# Patient Record
Sex: Male | Born: 1975 | Race: Black or African American | Hispanic: No | Marital: Single | Smoking: Never smoker
Health system: Southern US, Community
[De-identification: ages and names within clinical notes are randomized; demographics above are authoritative.]

## PROBLEM LIST (undated history)

## (undated) DIAGNOSIS — J189 Pneumonia, unspecified organism: Secondary | ICD-10-CM

## (undated) DIAGNOSIS — G43909 Migraine, unspecified, not intractable, without status migrainosus: Secondary | ICD-10-CM

## (undated) HISTORY — PX: TONSILLECTOMY: SUR1361

---

## 1998-09-17 ENCOUNTER — Encounter: Admission: RE | Admit: 1998-09-17 | Discharge: 1998-09-17 | Payer: Self-pay | Admitting: Family Medicine

## 1999-01-08 ENCOUNTER — Emergency Department (HOSPITAL_COMMUNITY): Admission: EM | Admit: 1999-01-08 | Discharge: 1999-01-08 | Payer: Self-pay | Admitting: Emergency Medicine

## 1999-03-26 ENCOUNTER — Emergency Department (HOSPITAL_COMMUNITY): Admission: EM | Admit: 1999-03-26 | Discharge: 1999-03-26 | Payer: Self-pay | Admitting: Emergency Medicine

## 1999-12-11 ENCOUNTER — Emergency Department (HOSPITAL_COMMUNITY): Admission: EM | Admit: 1999-12-11 | Discharge: 1999-12-11 | Payer: Self-pay | Admitting: Emergency Medicine

## 1999-12-11 ENCOUNTER — Encounter: Payer: Self-pay | Admitting: Emergency Medicine

## 2000-06-30 ENCOUNTER — Emergency Department (HOSPITAL_COMMUNITY): Admission: EM | Admit: 2000-06-30 | Discharge: 2000-06-30 | Payer: Self-pay | Admitting: Emergency Medicine

## 2001-07-02 ENCOUNTER — Emergency Department (HOSPITAL_COMMUNITY): Admission: EM | Admit: 2001-07-02 | Discharge: 2001-07-02 | Payer: Self-pay | Admitting: Emergency Medicine

## 2001-09-11 ENCOUNTER — Emergency Department (HOSPITAL_COMMUNITY): Admission: EM | Admit: 2001-09-11 | Discharge: 2001-09-11 | Payer: Self-pay | Admitting: Emergency Medicine

## 2001-11-01 ENCOUNTER — Emergency Department (HOSPITAL_COMMUNITY): Admission: EM | Admit: 2001-11-01 | Discharge: 2001-11-01 | Payer: Self-pay | Admitting: Emergency Medicine

## 2002-02-14 ENCOUNTER — Emergency Department (HOSPITAL_COMMUNITY): Admission: EM | Admit: 2002-02-14 | Discharge: 2002-02-14 | Payer: Self-pay | Admitting: Emergency Medicine

## 2002-06-05 ENCOUNTER — Emergency Department (HOSPITAL_COMMUNITY): Admission: EM | Admit: 2002-06-05 | Discharge: 2002-06-05 | Payer: Self-pay | Admitting: Emergency Medicine

## 2002-10-23 ENCOUNTER — Emergency Department (HOSPITAL_COMMUNITY): Admission: AD | Admit: 2002-10-23 | Discharge: 2002-10-23 | Payer: Self-pay | Admitting: Emergency Medicine

## 2003-08-08 ENCOUNTER — Emergency Department (HOSPITAL_COMMUNITY): Admission: EM | Admit: 2003-08-08 | Discharge: 2003-08-08 | Payer: Self-pay | Admitting: Emergency Medicine

## 2005-10-31 ENCOUNTER — Emergency Department (HOSPITAL_COMMUNITY): Admission: EM | Admit: 2005-10-31 | Discharge: 2005-11-01 | Payer: Self-pay | Admitting: Emergency Medicine

## 2006-04-25 ENCOUNTER — Emergency Department (HOSPITAL_COMMUNITY): Admission: EM | Admit: 2006-04-25 | Discharge: 2006-04-25 | Payer: Self-pay | Admitting: Emergency Medicine

## 2006-07-11 ENCOUNTER — Emergency Department (HOSPITAL_COMMUNITY): Admission: EM | Admit: 2006-07-11 | Discharge: 2006-07-11 | Payer: Self-pay | Admitting: Emergency Medicine

## 2007-02-25 ENCOUNTER — Emergency Department (HOSPITAL_COMMUNITY): Admission: EM | Admit: 2007-02-25 | Discharge: 2007-02-25 | Payer: Self-pay | Admitting: Emergency Medicine

## 2008-09-23 ENCOUNTER — Emergency Department (HOSPITAL_COMMUNITY): Admission: EM | Admit: 2008-09-23 | Discharge: 2008-09-23 | Payer: Self-pay | Admitting: Family Medicine

## 2009-06-14 ENCOUNTER — Emergency Department (HOSPITAL_COMMUNITY): Admission: EM | Admit: 2009-06-14 | Discharge: 2009-06-14 | Payer: Self-pay | Admitting: Family Medicine

## 2010-08-23 LAB — POCT URINALYSIS DIP (DEVICE)
Hgb urine dipstick: NEGATIVE
Protein, ur: NEGATIVE mg/dL
Specific Gravity, Urine: 1.025 (ref 1.005–1.030)
Urobilinogen, UA: 1 mg/dL (ref 0.0–1.0)
pH: 6 (ref 5.0–8.0)

## 2011-12-05 ENCOUNTER — Emergency Department (HOSPITAL_COMMUNITY)
Admission: EM | Admit: 2011-12-05 | Discharge: 2011-12-05 | Disposition: A | Discharge status: Home / Self Care | Payer: Self-pay | Attending: Emergency Medicine | Admitting: Emergency Medicine

## 2011-12-05 ENCOUNTER — Encounter (HOSPITAL_COMMUNITY): Payer: Self-pay | Admitting: *Deleted

## 2011-12-05 DIAGNOSIS — R110 Nausea: Secondary | ICD-10-CM | POA: Insufficient documentation

## 2011-12-05 DIAGNOSIS — G43909 Migraine, unspecified, not intractable, without status migrainosus: Secondary | ICD-10-CM | POA: Insufficient documentation

## 2011-12-05 HISTORY — DX: Migraine, unspecified, not intractable, without status migrainosus: G43.909

## 2011-12-05 MED ORDER — SUMATRIPTAN SUCCINATE 6 MG/0.5ML ~~LOC~~ SOLN
6.0000 mg | Freq: Once | SUBCUTANEOUS | Status: AC
  Administered 2011-12-05: 6 mg via SUBCUTANEOUS
  Filled 2011-12-05 (×2): qty 0.5

## 2011-12-05 MED ORDER — KETOROLAC TROMETHAMINE 60 MG/2ML IM SOLN
60.0000 mg | Freq: Once | INTRAMUSCULAR | Status: AC
  Administered 2011-12-05: 30 mg via INTRAMUSCULAR
  Filled 2011-12-05: qty 2

## 2011-12-05 MED ORDER — METOCLOPRAMIDE HCL 5 MG/ML IJ SOLN
10.0000 mg | Freq: Once | INTRAMUSCULAR | Status: AC
  Administered 2011-12-05: 10 mg via INTRAVENOUS
  Filled 2011-12-05: qty 2

## 2011-12-05 MED ORDER — SODIUM CHLORIDE 0.9 % IV BOLUS (SEPSIS)
1000.0000 mL | Freq: Once | INTRAVENOUS | Status: AC
  Administered 2011-12-05: 1000 mL via INTRAVENOUS

## 2011-12-05 MED ORDER — SUMATRIPTAN SUCCINATE 50 MG PO TABS: 50.0000 mg | ORAL_TABLET | ORAL | Status: DC | PRN

## 2011-12-05 MED ORDER — DIPHENHYDRAMINE HCL 50 MG/ML IJ SOLN
25.0000 mg | Freq: Once | INTRAMUSCULAR | Status: AC
  Administered 2011-12-05: 25 mg via INTRAVENOUS
  Filled 2011-12-05: qty 1

## 2011-12-05 NOTE — ED Notes (Signed)
C/o migraine headache that started tonight

## 2011-12-05 NOTE — ED Provider Notes (Signed)
Medical screening examination/treatment/procedure(s) were performed by non-physician practitioner and as supervising physician I was immediately available for consultation/collaboration.   Mariesha Venturella, MD 12/05/11 0704 

## 2011-12-05 NOTE — ED Provider Notes (Signed)
History     CSN: [id number]  Arrival date & time 12/05/11  0205   First MD Initiated Contact with Patient 12/05/11 [phone]      Chief Complaint  Patient presents with  . Migraine    (Consider location/radiation/quality/duration/timing/severity/associated sxs/prior treatment) HPI Comments: Patient presents emergency department with chief complaint of migraine.  Patient has a history of migraines and status of his typical migraine.  He was working at [employer] when he suddenly felt a headache coming on.  He is associated with photophobia, blurred vision, and nausea as usual.  He denies any ataxia, disequilibrium, diplopia,  Patient is a 36 y.o. male presenting with migraine. The history is provided by the patient.  Migraine Associated symptoms include headaches and nausea. Pertinent negatives include no abdominal pain, chest pain, chills, congestion, coughing, diaphoresis, fatigue, fever, myalgias, neck pain, numbness, rash, vomiting or weakness.    Past Medical History  Diagnosis Date  . Migraine     Past Surgical History  Procedure Date  . Tonsillectomy     No family history on file.  History  Substance Use Topics  . Smoking status: Never Smoker   . Smokeless tobacco: Not on file  . Alcohol Use: No      Review of Systems  Constitutional: Positive for activity change. Negative for fever, chills, diaphoresis and fatigue.  HENT: Negative for ear pain, congestion, facial swelling, neck pain, neck stiffness, sinus pressure and tinnitus.   Eyes: Positive for photophobia. Negative for redness and visual disturbance.  Respiratory: Negative for cough, shortness of breath, wheezing and stridor.   Cardiovascular: Negative for chest pain.  Gastrointestinal: Positive for nausea. Negative for vomiting and abdominal pain.  Musculoskeletal: Negative for myalgias and gait problem.  Skin: Negative for rash.  Neurological: Positive for headaches. Negative for dizziness, syncope, speech  difficulty, weakness, light-headedness and numbness.       No bowel or bladder incontinence.  Psychiatric/Behavioral: Negative for confusion.  All other systems reviewed and are negative.    Allergies  Ibuprofen  Home Medications  No current outpatient prescriptions on file.  BP 127/87  Pulse 74  Temp 97.9 F (36.6 C)  Resp 16  SpO2 99%  Physical Exam  Nursing note and vitals reviewed. Constitutional: He is oriented to person, place, and time. He appears well-developed and well-nourished. No distress.  HENT:  Head: Normocephalic and atraumatic.  Right Ear: External ear normal.  Left Ear: External ear normal.  Eyes: Conjunctivae and EOM are normal. Pupils are equal, round, and reactive to light. Right eye exhibits no discharge. Left eye exhibits no discharge. Right conjunctiva is not injected. Right conjunctiva has no hemorrhage. Left conjunctiva is not injected. Left conjunctiva has no hemorrhage. No scleral icterus. Right eye exhibits no nystagmus. Left eye exhibits no nystagmus.  Neck: Normal range of motion and full passive range of motion without pain. Neck supple. No spinous process tenderness present. No rigidity.  Cardiovascular: Normal rate, regular rhythm, normal heart sounds and intact distal pulses.   Pulmonary/Chest: Effort normal and breath sounds normal. No respiratory distress.  Musculoskeletal: Normal range of motion.  Neurological: He is alert and oriented to person, place, and time. He has normal strength. No cranial nerve deficit or sensory deficit. Coordination and gait normal.  Skin: Skin is warm and dry. No rash noted. He is not diaphoretic.    ED Course  Procedures (including critical care time)  Labs Reviewed - No data to display No results found.   No diagnosis found.  MDM  Migraine  Pt HA treated and improved while in ED.  Presentation is like pts typical HA and non concerning for [facility], [facility]CH, Meningitis, or temporal arteritis. Pt is afebrile  with no focal neuro deficits, nuchal rigidity, or change in vision. Pt is to follow up with PCP to discuss prophylactic medication. Pt verbalizes understanding and is agreeable with plan to dc.          Jaci Carrel, [place] 12/05/11 [phone]

## 2013-06-25 ENCOUNTER — Encounter (HOSPITAL_COMMUNITY): Payer: Self-pay | Admitting: Emergency Medicine

## 2013-06-25 ENCOUNTER — Emergency Department (HOSPITAL_COMMUNITY): Payer: Managed Care, Other (non HMO)

## 2013-06-25 ENCOUNTER — Emergency Department (HOSPITAL_COMMUNITY)
Admission: EM | Admit: 2013-06-25 | Discharge: 2013-06-26 | Disposition: A | Discharge status: Home / Self Care | Payer: Managed Care, Other (non HMO) | Attending: Emergency Medicine | Admitting: Emergency Medicine

## 2013-06-25 DIAGNOSIS — Z8701 Personal history of pneumonia (recurrent): Secondary | ICD-10-CM | POA: Insufficient documentation

## 2013-06-25 DIAGNOSIS — S29011A Strain of muscle and tendon of front wall of thorax, initial encounter: Secondary | ICD-10-CM

## 2013-06-25 DIAGNOSIS — Y939 Activity, unspecified: Secondary | ICD-10-CM | POA: Insufficient documentation

## 2013-06-25 DIAGNOSIS — Z79899 Other long term (current) drug therapy: Secondary | ICD-10-CM | POA: Insufficient documentation

## 2013-06-25 DIAGNOSIS — Y929 Unspecified place or not applicable: Secondary | ICD-10-CM | POA: Insufficient documentation

## 2013-06-25 DIAGNOSIS — S4980XA Other specified injuries of shoulder and upper arm, unspecified arm, initial encounter: Secondary | ICD-10-CM | POA: Insufficient documentation

## 2013-06-25 DIAGNOSIS — IMO0002 Reserved for concepts with insufficient information to code with codable children: Secondary | ICD-10-CM | POA: Insufficient documentation

## 2013-06-25 DIAGNOSIS — G43909 Migraine, unspecified, not intractable, without status migrainosus: Secondary | ICD-10-CM | POA: Insufficient documentation

## 2013-06-25 DIAGNOSIS — S46909A Unspecified injury of unspecified muscle, fascia and tendon at shoulder and upper arm level, unspecified arm, initial encounter: Secondary | ICD-10-CM | POA: Insufficient documentation

## 2013-06-25 DIAGNOSIS — X58XXXA Exposure to other specified factors, initial encounter: Secondary | ICD-10-CM | POA: Insufficient documentation

## 2013-06-25 HISTORY — DX: Pneumonia, unspecified organism: J18.9

## 2013-06-25 LAB — CBC
HEMATOCRIT: 40 % (ref 39.0–52.0)
HEMOGLOBIN: 14.2 g/dL (ref 13.0–17.0)
MCH: 29.4 pg (ref 26.0–34.0)
MCHC: 35.5 g/dL (ref 30.0–36.0)
MCV: 82.8 fL (ref 78.0–100.0)
Platelets: 340 10*3/uL (ref 150–400)
RBC: 4.83 MIL/uL (ref 4.22–5.81)
RDW: 13.6 % (ref 11.5–15.5)
WBC: 9 10*3/uL (ref 4.0–10.5)

## 2013-06-25 LAB — BASIC METABOLIC PANEL
BUN: 20 mg/dL (ref 6–23)
CHLORIDE: 98 meq/L (ref 96–112)
CO2: 25 mEq/L (ref 19–32)
Calcium: 9 mg/dL (ref 8.4–10.5)
Creatinine, Ser: 1.09 mg/dL (ref 0.50–1.35)
GFR calc Af Amer: >90 mL/min (ref >90)
GFR, EST NON AFRICAN AMERICAN: 85 mL/min — AB (ref >90)
GLUCOSE: 118 mg/dL — AB (ref 70–99)
POTASSIUM: 3.8 meq/L (ref 3.7–5.3)
SODIUM: 137 meq/L (ref 137–147)

## 2013-06-25 LAB — POCT I-STAT TROPONIN I: Troponin i, poc: 0.01 ng/mL (ref 0.00–0.08)

## 2013-06-25 NOTE — ED Notes (Signed)
Pt. reports left chest pain radiating to left arm onset last night unrelieved by OTC pain medications , denies SOB / no nausea or diaphoresis .

## 2013-06-26 MED ORDER — METHOCARBAMOL 500 MG PO TABS
1000.0000 mg | ORAL_TABLET | Freq: Once | ORAL | Status: AC
  Administered 2013-06-26: 1000 mg via ORAL
  Filled 2013-06-26: qty 2

## 2013-06-26 MED ORDER — METHOCARBAMOL 750 MG PO TABS: 750.0000 mg | ORAL_TABLET | Freq: Four times a day (QID) | ORAL | Status: AC

## 2013-06-26 MED ORDER — HYDROCODONE-ACETAMINOPHEN 5-325 MG PO TABS
2.0000 | ORAL_TABLET | ORAL | Status: DC | PRN
  Administered 2013-06-26: 2 via ORAL
  Filled 2013-06-26: qty 2

## 2013-06-26 MED ORDER — HYDROCODONE-ACETAMINOPHEN 5-325 MG PO TABS: 2.0000 | ORAL_TABLET | ORAL | Status: AC | PRN

## 2013-06-26 MED ORDER — NAPROXEN 500 MG PO TABS: 500.0000 mg | ORAL_TABLET | Freq: Two times a day (BID) | ORAL | Status: AC

## 2013-06-26 NOTE — ED Provider Notes (Signed)
CSN: [id number]     Arrival date & time 06/25/13  2219 History   First MD Initiated Contact with Patient 06/25/13 2348     Chief Complaint  Patient presents with  . Chest Pain     (Consider location/radiation/quality/duration/timing/severity/associated sxs/prior Treatment) HPI 38 year old male presents to emergency apartment with complaint of left upper chest.  Pain, radiating to his left arm, starting last night.  Patient was diagnosed with a right-sided pneumonia, last week.  He is taking his antibiotics, and reports his fever and cough are much better.  He has tried multiple over-the-counter medications to help with the pain.  He reports the pain is only with palpation and movement of his left arm.  He denies any known trauma or injury to the arm.  Patient is right-hand dominant, and secondary to pain from his pneumonia was not able to use his right arm as much.  Patient denies any shortness of breath, leg swelling.  Patient has tramadol at home, which has not helped with his left shoulder pain Past Medical History  Diagnosis Date  . Migraine   . Pneumonia    Past Surgical History  Procedure Laterality Date  . Tonsillectomy     No family history on file. History  Substance Use Topics  . Smoking status: Never Smoker   . Smokeless tobacco: Not on file  . Alcohol Use: No    Review of Systems  See History of Present Illness; otherwise all other systems are reviewed and negative   Allergies  Ibuprofen and Oxycodone  Home Medications   Current Outpatient Rx  Name  Route  Sig  Dispense  Refill  . promethazine (PHENERGAN) 25 MG tablet   Oral   Take 25 mg by mouth every 6 (six) hours as needed for nausea or vomiting.         . rizatriptan (MAXALT) 5 MG tablet   Oral   Take 5 mg by mouth as needed for migraine. May repeat in 2 hours if needed         . Vitamin D, Ergocalciferol, (DRISDOL) 50000 UNITS CAPS capsule   Oral   Take 50,000 Units by mouth every 7 (seven) days.  Take on Wednesday         . HYDROcodone-acetaminophen (NORCO/VICODIN) 5-325 MG per tablet   Oral   Take 2 tablets by mouth every 4 (four) hours as needed for moderate pain.   30 tablet   0   . methocarbamol (ROBAXIN-750) 750 MG tablet   Oral   Take 1 tablet (750 mg total) by mouth 4 (four) times daily.   40 tablet   0   . naproxen (NAPROSYN) 500 MG tablet   Oral   Take 1 tablet (500 mg total) by mouth 2 (two) times daily.   30 tablet   0    BP 137/93  Pulse 107  Temp(Src) 98 F (36.7 C) (Oral)  Resp 18  Ht 5\' 6"  (1.676 m)  Wt 184 lb (83.462 kg)  BMI 29.71 kg/m2  SpO2 99% Physical Exam  Nursing note and vitals reviewed. Constitutional: He is oriented to person, place, and time. He appears well-developed and well-nourished.  HENT:  Head: Normocephalic and atraumatic.  Right Ear: External ear normal.  Left Ear: External ear normal.  Nose: Nose normal.  Mouth/Throat: Oropharynx is clear and moist.  Eyes: Conjunctivae and EOM are normal. Pupils are equal, round, and reactive to light.  Neck: Normal range of motion. Neck supple. No JVD present. No  tracheal deviation present. No thyromegaly present.  Cardiovascular: Normal rate, regular rhythm, normal heart sounds and intact distal pulses.  Exam reveals no gallop and no friction rub.   No murmur heard. Pulmonary/Chest: Effort normal and breath sounds normal. No stridor. No respiratory distress. He has no wheezes. He has no rales. He exhibits tenderness (patient has been or tenderness over the left pectoral muscle, at the anterior axilla.  Patient is area reproduces pain.  Movement of the arm causes pain).  Abdominal: Soft. Bowel sounds are normal. He exhibits no distension and no mass. There is no tenderness. There is no rebound and no guarding.  Musculoskeletal: He exhibits tenderness. He exhibits no edema.  Tender palpation over left pectoral, left axilla.  Lymphadenopathy:    He has no cervical adenopathy.   Neurological: He is alert and oriented to person, place, and time. He has normal reflexes. No cranial nerve deficit. He exhibits normal muscle tone. Coordination normal.  Skin: Skin is warm and dry. No rash noted. No erythema. No pallor.  Psychiatric: He has a normal mood and affect. His behavior is normal. Judgment and thought content normal.    ED Course  Procedures (including critical care time) Labs Review Labs Reviewed  BASIC METABOLIC PANEL - Abnormal; Notable for the following:    Glucose, Bld 118 (*)    GFR calc non Af Amer 85 (*)    All other components within normal limits  CBC  POCT I-STAT TROPONIN I   Imaging Review Dg Chest 2 View  06/25/2013   CLINICAL DATA:  Left chest pain  EXAM: CHEST  2 VIEW  COMPARISON:  June 14, 2009  FINDINGS: The heart size and mediastinal contours are within normal limits. Small vessel on end versus nodule is identified in the left lung base unchanged compared to prior exam of 2011. There is no focal infiltrate, pulmonary edema, or pleural effusion. There is scoliosis of spine. The visualized skeletal structures are otherwise unremarkable.  IMPRESSION: No active cardiopulmonary disease.   Electronically Signed   By: Sherian Rein M.D.   On: 06/25/2013 23:16    EKG Interpretation    Date/Time:  Wednesday June 25 2013 22:23:02 EST Ventricular Rate:  113 PR Interval:  140 QRS Duration: 94 QT Interval:  320 QTC Calculation: 438 R Axis:   71 Text Interpretation:  Sinus tachycardia Possible Lateral infarct , age undetermined Abnormal ECG Since last tracing rate faster Confirmed by Bryanda Mikel  MD, Karl Erway ([id number]) on 06/25/2013 11:51:05 PM            MDM   Final diagnoses:  Chest wall muscle strain    38 year old male with left chest wall pain.  Will treat with Robaxin, Percocet.  Patient instructed to continue his anti-inflammatories.    Olivia Mackie, MD 06/26/13 [phone]

## 2013-06-26 NOTE — ED Notes (Signed)
Pt denied allergy to HYDROcodone-acetaminophen.

## 2013-06-26 NOTE — ED Notes (Signed)
Pt denies allergy to HYDROcodone-acetaminophen 5-325 tablets.

## 2013-06-26 NOTE — Discharge Instructions (Signed)

## 2013-06-26 NOTE — ED Notes (Signed)
Pt given d/c instructions and verbalized understanding.  

## 2013-06-29 ENCOUNTER — Emergency Department (HOSPITAL_BASED_OUTPATIENT_CLINIC_OR_DEPARTMENT_OTHER): Payer: Managed Care, Other (non HMO)

## 2013-06-29 ENCOUNTER — Encounter (HOSPITAL_BASED_OUTPATIENT_CLINIC_OR_DEPARTMENT_OTHER): Payer: Self-pay | Admitting: Emergency Medicine

## 2013-06-29 ENCOUNTER — Emergency Department (HOSPITAL_BASED_OUTPATIENT_CLINIC_OR_DEPARTMENT_OTHER)
Admission: EM | Admit: 2013-06-29 | Discharge: 2013-06-29 | Disposition: A | Discharge status: Home / Self Care | Payer: Managed Care, Other (non HMO) | Attending: Emergency Medicine | Admitting: Emergency Medicine

## 2013-06-29 DIAGNOSIS — G43909 Migraine, unspecified, not intractable, without status migrainosus: Secondary | ICD-10-CM | POA: Insufficient documentation

## 2013-06-29 DIAGNOSIS — M25519 Pain in unspecified shoulder: Secondary | ICD-10-CM | POA: Insufficient documentation

## 2013-06-29 DIAGNOSIS — Z8701 Personal history of pneumonia (recurrent): Secondary | ICD-10-CM | POA: Insufficient documentation

## 2013-06-29 DIAGNOSIS — Z79899 Other long term (current) drug therapy: Secondary | ICD-10-CM | POA: Insufficient documentation

## 2013-06-29 DIAGNOSIS — Z791 Long term (current) use of non-steroidal anti-inflammatories (NSAID): Secondary | ICD-10-CM | POA: Insufficient documentation

## 2013-06-29 MED ORDER — MORPHINE SULFATE 4 MG/ML IJ SOLN
6.0000 mg | Freq: Once | INTRAMUSCULAR | Status: AC
  Administered 2013-06-29: 6 mg via INTRAMUSCULAR
  Filled 2013-06-29: qty 2

## 2013-06-29 MED ORDER — OXYCODONE-ACETAMINOPHEN 5-325 MG PO TABS: 1.0000 | ORAL_TABLET | Freq: Four times a day (QID) | ORAL | Status: AC | PRN

## 2013-06-29 NOTE — ED Notes (Signed)
Patient's nephew picked him up

## 2013-06-29 NOTE — Discharge Instructions (Signed)
Return to the ED with any concerns including weakness of arm, chest pain, swelling of arm, fever, or any other alarming symptoms

## 2013-06-29 NOTE — ED Notes (Signed)
Patient here with ongoing left arm and shoulder pain since Thursday. Was seen on Friday at [facility] ED and started on robaxin and hydrocodone without any relief. Pain to left arm with any ROM, denies any known injury

## 2013-06-29 NOTE — ED Provider Notes (Signed)
CSN: [id number]     Arrival date & time 06/29/13  1117 History   First MD Initiated Contact with Patient 06/29/13 1310     Chief Complaint  Patient presents with  . Arm Pain     (Consider location/radiation/quality/duration/timing/severity/associated sxs/prior Treatment) HPI Pt presents with c/o shoulder pain in left shoulder.  He states pain began approx 5 days ago.  Pain worse with movement and palpation.  Pt states [facility] on the day the pain began.  Per chart review he was evaluated with labs, ekg, CXR as he c/o some chest pain at that time as well.  For the past several days he states pain is localized to the left shoulder.  Pain is shooting down to left hand.  Has been taking hydrocodone which has not helped pain.  No chest pain, no sob, no swelling of extremities.  No known injury.  There are no other associated systemic symptoms, there are no other alleviating or modifying factors.   Past Medical History  Diagnosis Date  . Migraine   . Pneumonia    Past Surgical History  Procedure Laterality Date  . Tonsillectomy     No family history on file. History  Substance Use Topics  . Smoking status: Never Smoker   . Smokeless tobacco: Not on file  . Alcohol Use: No    Review of Systems ROS reviewed and all otherwise negative except for mentioned in HPI    Allergies  Ibuprofen and Oxycodone  Home Medications   Current Outpatient Rx  Name  Route  Sig  Dispense  Refill  . HYDROcodone-acetaminophen (NORCO/VICODIN) 5-325 MG per tablet   Oral   Take 2 tablets by mouth every 4 (four) hours as needed for moderate pain.   30 tablet   0   . methocarbamol (ROBAXIN-750) 750 MG tablet   Oral   Take 1 tablet (750 mg total) by mouth 4 (four) times daily.   40 tablet   0   . naproxen (NAPROSYN) 500 MG tablet   Oral   Take 1 tablet (500 mg total) by mouth 2 (two) times daily.   30 tablet   0   . oxyCODONE-acetaminophen (PERCOCET/ROXICET) 5-325 MG per tablet  Oral   Take 1-2 tablets by mouth every 6 (six) hours as needed for severe pain.   15 tablet   0   . promethazine (PHENERGAN) 25 MG tablet   Oral   Take 25 mg by mouth every 6 (six) hours as needed for nausea or vomiting.         . rizatriptan (MAXALT) 5 MG tablet   Oral   Take 5 mg by mouth as needed for migraine. May repeat in 2 hours if needed         . Vitamin D, Ergocalciferol, (DRISDOL) 50000 UNITS CAPS capsule   Oral   Take 50,000 Units by mouth every 7 (seven) days. Take on Wednesday          BP 126/84  Pulse 81  Temp(Src) 97.8 F (36.6 C) (Oral)  Resp 16  Ht 5\' 6"  (1.676 m)  Wt 184 lb (83.462 kg)  BMI 29.71 kg/m2  SpO2 98% Vitals reviewed Physical Exam Physical Examination: General appearance - alert, well appearing, and in no distress Mental status - alert, oriented to person, place, and time Eyes - no conjunctival injection, no scleral icterus Neck - supple, no significant adenopathy Chest - clear to auscultation, no wheezes, rales or rhonchi, symmetric air entry  Heart - normal rate, regular rhythm, normal S1, S2, no murmurs, rubs, clicks or gallops, 2+ radial pulses and distally cap refill < 3 seconds Neurological - alert, oriented, normal speech, strength 5/5 in LUE, sensation distally intact Musculoskeletal - ttp over anterior left shoulder, pain with ROM, otherwise no joint tenderness, deformity or swelling Extremities - peripheral pulses normal, no pedal edema, no clubbing or cyanosis Skin - normal coloration and turgor, no rashes  ED Course  Procedures (including critical care time)  3:25 PM pt states he is not allergic to oxycodone- states he had "hallucinations" after his tonsillectomy but he states he feels that was due to the anesthesia.  Would like to try taking percocet again as hydrocodone is not helping with pain. Will give short rx and advised f/u with orthopedics  3:53 PM pt feels much improved after pain medications, voluntary ROM of  shoulder is much improved  Labs Review Labs Reviewed - No data to display Imaging Review Dg Shoulder Left  06/29/2013   CLINICAL DATA:  Left shoulder pain, decreased range of motion  EXAM: LEFT SHOULDER - 2+ VIEW  COMPARISON:  None.  FINDINGS: The provided axillary view is degraded due to obliquity. No definite fracture dislocation. Glenohumeral and acromioclavicular joint spaces appear preserved given obliquity. No definite evidence of calcific tendinitis. Limited visualization adjacent thorax is normal. Regional soft tissues appear normal.  IMPRESSION: Degraded examination without definite acute findings.   Electronically Signed   By: Simonne Come M.D.   On: 06/29/2013 14:38    EKG Interpretation   None       MDM   Final diagnoses:  Shoulder pain    Pt presenting with c/o left shoulder pain, pain with ROM movements.  Suspect rotator cuff, no acute findings on xray.  Pt vitals improved after pain meds.  Given information for f/u with orthopedics    Ethelda Chick, MD 06/29/13 1555

## 2015-01-29 IMAGING — CR DG CHEST 2V
2 series · 2 of 2 positions shown · non-contrast
Comparison: June 14, 2009

CLINICAL DATA: Left chest pain

EXAM:
CHEST  2 VIEW

[w chest pa]
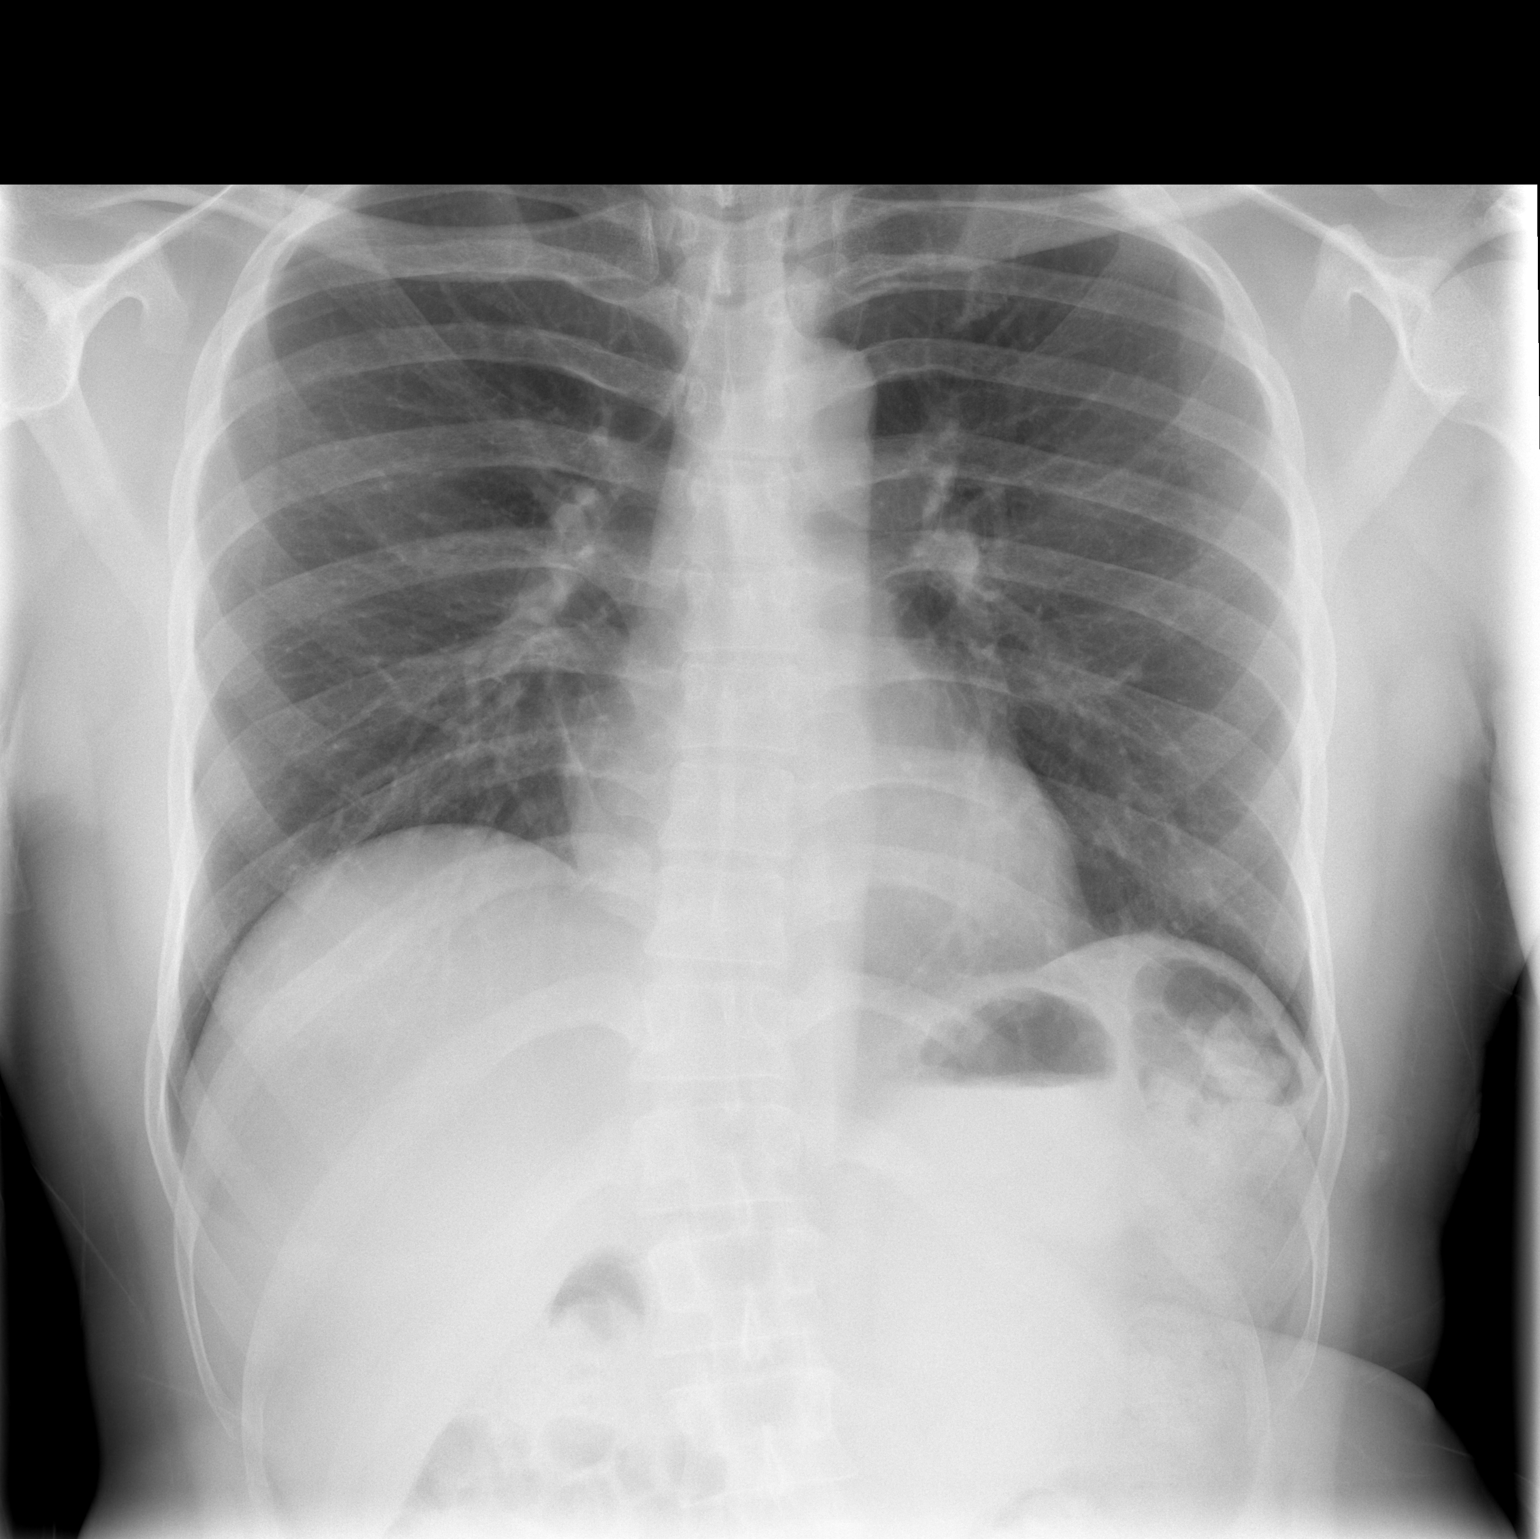

[w chest lat]
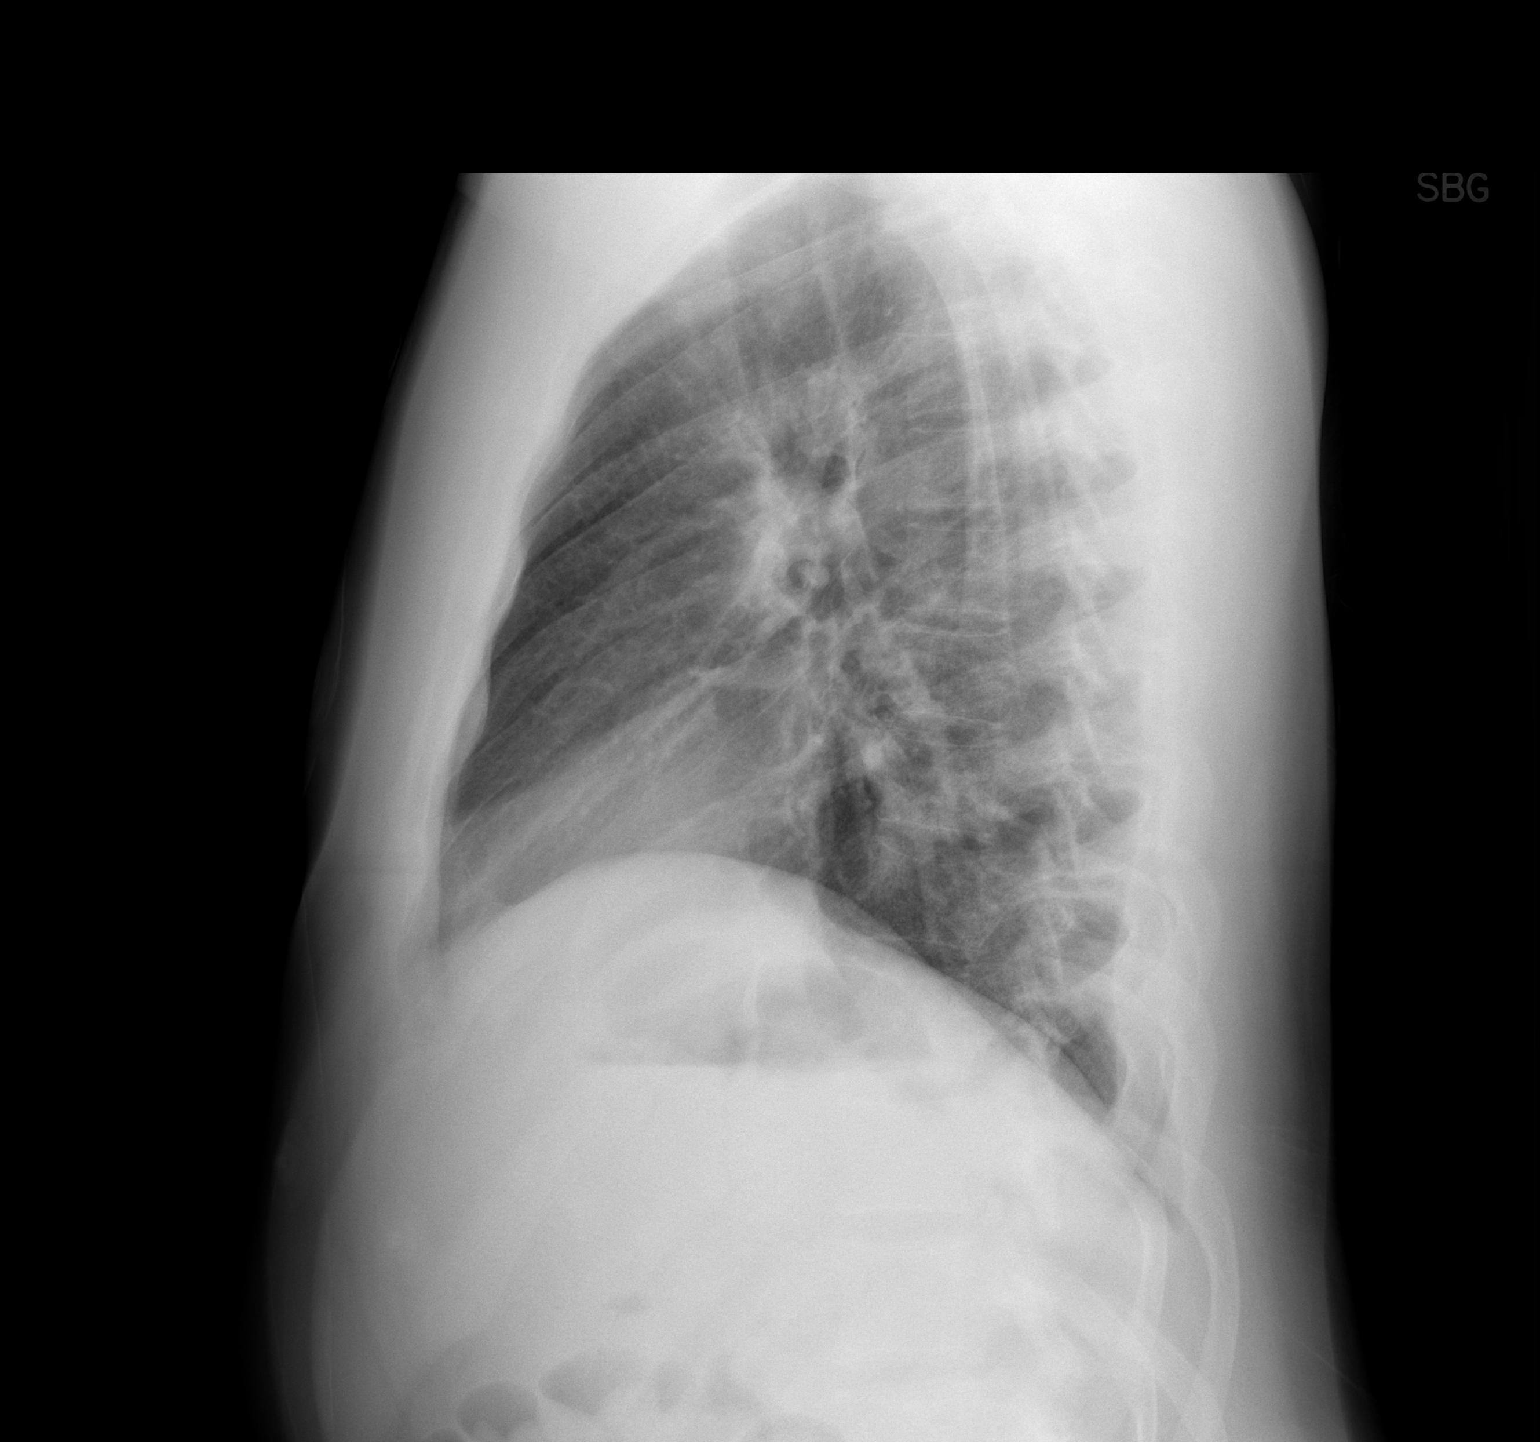

[2 of 2 positions shown; findings below may reference images not displayed]

FINDINGS: The heart size and mediastinal contours are within normal limits.
Small vessel on end versus nodule is identified in the left lung
base unchanged compared to prior exam of 2199. There is no focal
infiltrate, pulmonary edema, or pleural effusion. There is scoliosis
of spine. The visualized skeletal structures are otherwise
unremarkable.
IMPRESSION: No active cardiopulmonary disease.

## 2015-02-02 IMAGING — CR DG SHOULDER 2+V*L*
3 series · 3 of 3 positions shown · non-contrast
Comparison: None.

CLINICAL DATA: Left shoulder pain, decreased range of motion

EXAM:
LEFT SHOULDER - 2+ VIEW

[w shoulder ap internal left]
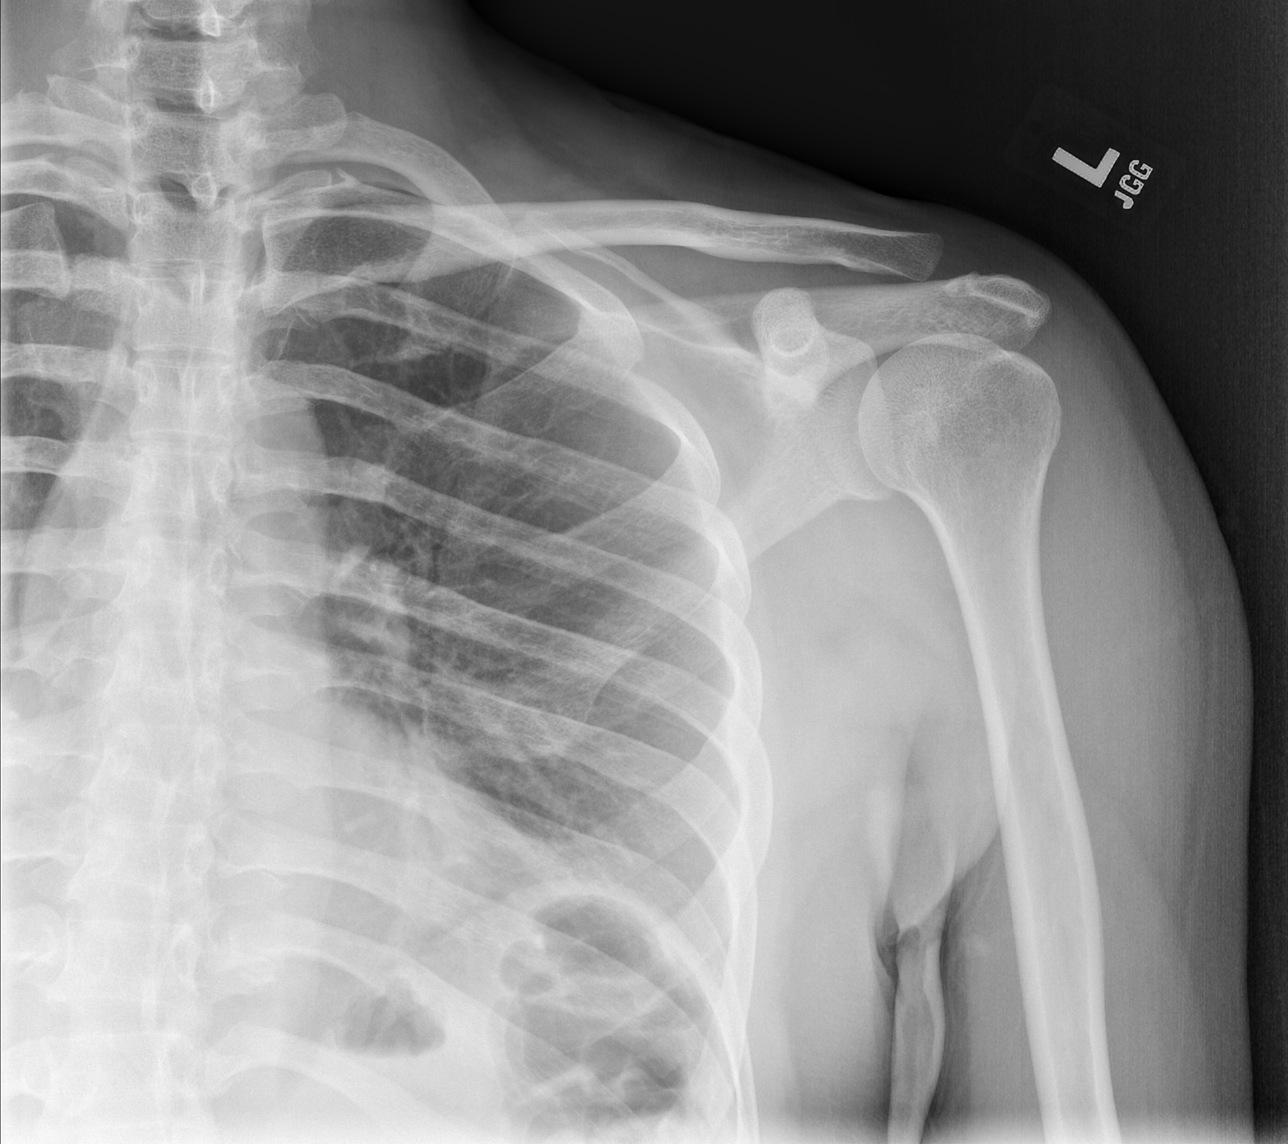

[w shoulder y view left]
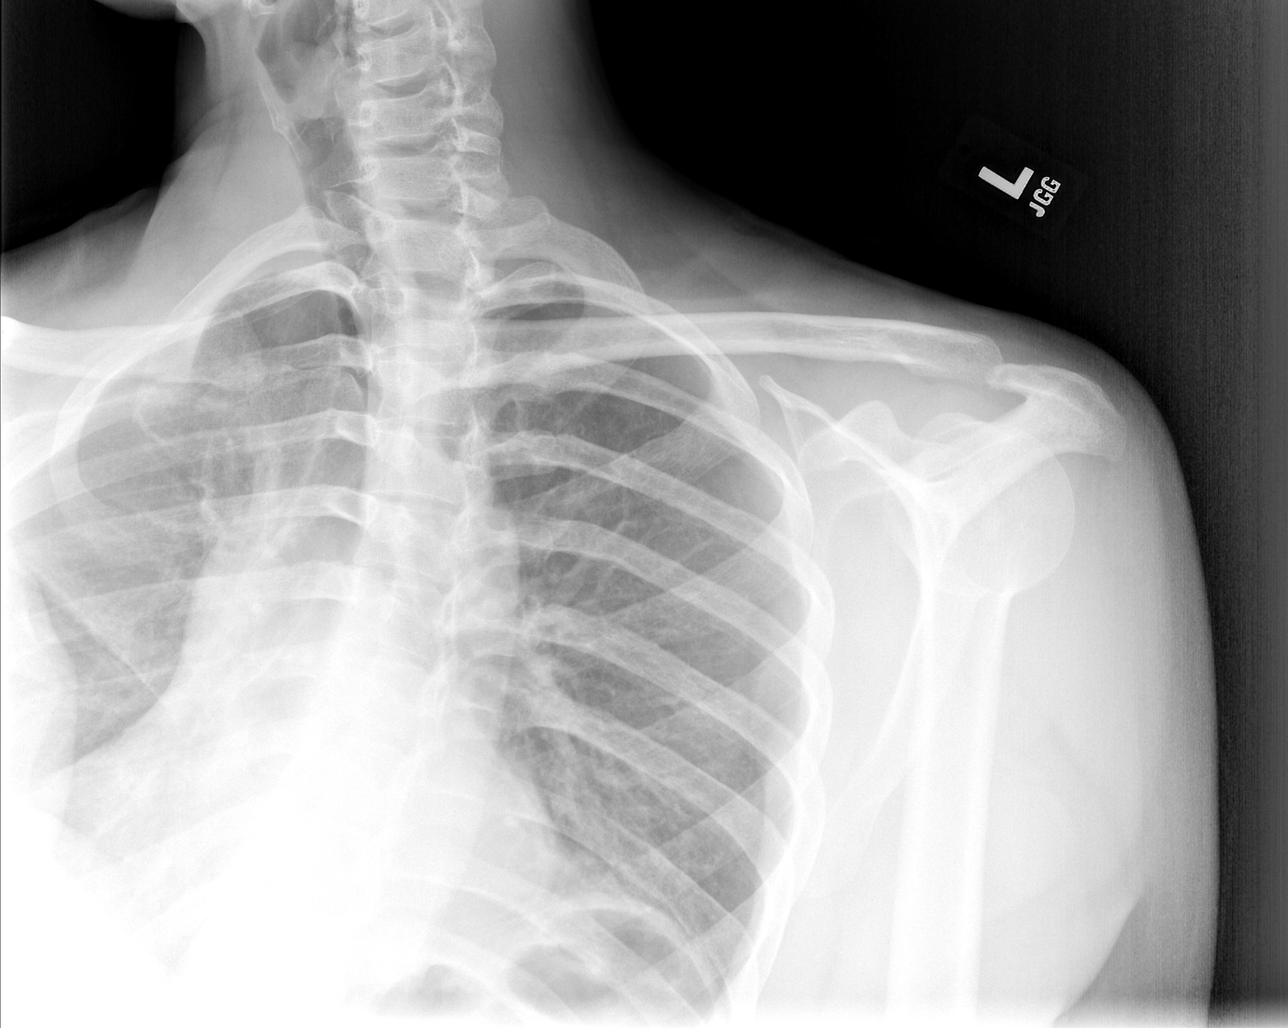

[x shoulder axillary left *]
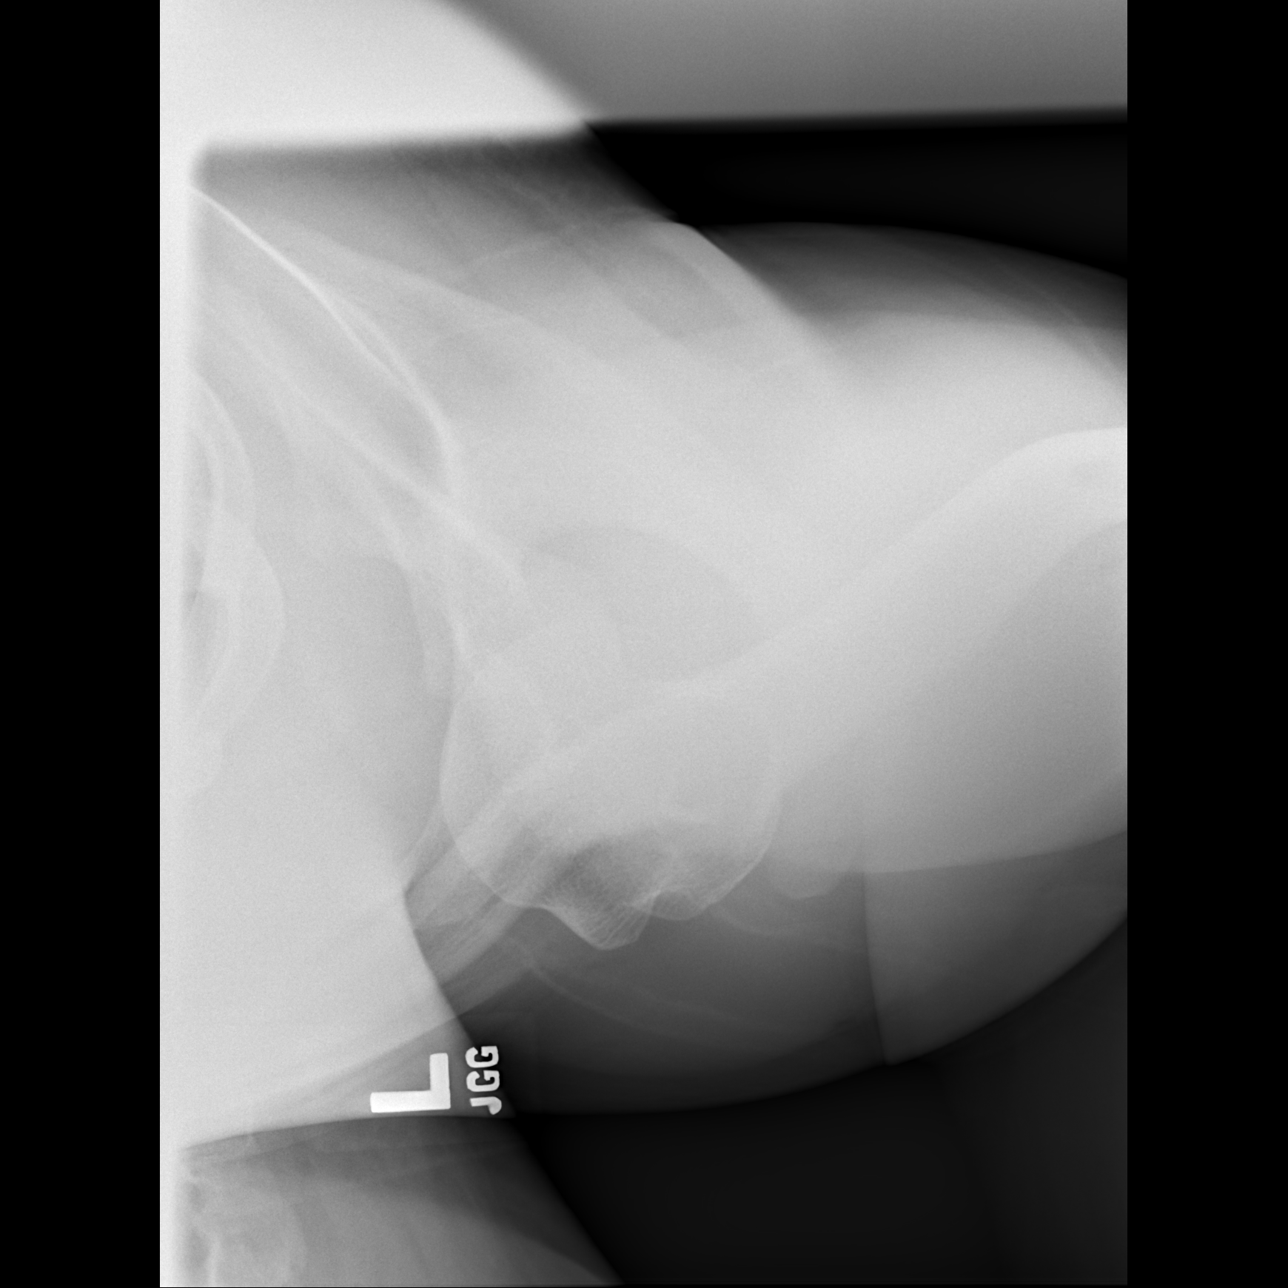

[3 of 3 positions shown; findings below may reference images not displayed]

FINDINGS: The provided axillary view is degraded due to obliquity. No definite
fracture dislocation. Glenohumeral and acromioclavicular joint
spaces appear preserved given obliquity. No definite evidence of
calcific tendinitis. Limited visualization adjacent thorax is
normal. Regional soft tissues appear normal.
IMPRESSION: Degraded examination without definite acute findings.

## 2024-10-13 ENCOUNTER — Ambulatory Visit: Admitting: Physician Assistant
